# Patient Record
Sex: Female | Born: 1975
Health system: Southern US, Community
[De-identification: ages and names within clinical notes are randomized; demographics above are authoritative.]

## PROBLEM LIST (undated history)

## (undated) DIAGNOSIS — G43909 Migraine, unspecified, not intractable, without status migrainosus: Secondary | ICD-10-CM

## (undated) DIAGNOSIS — J449 Chronic obstructive pulmonary disease, unspecified: Secondary | ICD-10-CM

## (undated) DIAGNOSIS — F319 Bipolar disorder, unspecified: Secondary | ICD-10-CM

## (undated) DIAGNOSIS — E079 Disorder of thyroid, unspecified: Secondary | ICD-10-CM

## (undated) DIAGNOSIS — K219 Gastro-esophageal reflux disease without esophagitis: Secondary | ICD-10-CM

## (undated) DIAGNOSIS — J45909 Unspecified asthma, uncomplicated: Secondary | ICD-10-CM

## (undated) HISTORY — PX: ABDOMINAL HYSTERECTOMY: SHX81

---

## 2009-05-20 ENCOUNTER — Ambulatory Visit: Payer: Self-pay | Admitting: Family Medicine

## 2009-06-03 ENCOUNTER — Ambulatory Visit: Payer: Self-pay | Admitting: Family Medicine

## 2009-06-24 ENCOUNTER — Ambulatory Visit: Payer: Self-pay | Admitting: Family Medicine

## 2009-07-11 ENCOUNTER — Emergency Department (HOSPITAL_COMMUNITY): Admission: EM | Admit: 2009-07-11 | Discharge: 2009-07-11 | Payer: Self-pay | Admitting: Family Medicine

## 2009-07-22 ENCOUNTER — Encounter: Admission: RE | Admit: 2009-07-22 | Discharge: 2009-07-22 | Payer: Self-pay | Admitting: Emergency Medicine

## 2009-08-20 ENCOUNTER — Encounter: Admission: RE | Admit: 2009-08-20 | Discharge: 2009-11-18 | Payer: Self-pay | Admitting: Family Medicine

## 2009-09-01 ENCOUNTER — Ambulatory Visit: Payer: Self-pay | Admitting: Family Medicine

## 2009-11-27 ENCOUNTER — Ambulatory Visit: Payer: Self-pay | Admitting: Family Medicine

## 2009-12-30 ENCOUNTER — Ambulatory Visit: Payer: Self-pay | Admitting: Family Medicine

## 2010-01-14 ENCOUNTER — Ambulatory Visit: Payer: Self-pay | Admitting: Family Medicine

## 2010-01-19 ENCOUNTER — Encounter: Admission: RE | Admit: 2010-01-19 | Discharge: 2010-01-19 | Payer: Self-pay | Admitting: Family Medicine

## 2010-01-19 ENCOUNTER — Ambulatory Visit: Payer: Self-pay | Admitting: Family Medicine

## 2010-01-26 ENCOUNTER — Encounter: Admission: RE | Admit: 2010-01-26 | Discharge: 2010-01-26 | Payer: Self-pay | Admitting: Family Medicine

## 2010-02-12 ENCOUNTER — Ambulatory Visit (HOSPITAL_BASED_OUTPATIENT_CLINIC_OR_DEPARTMENT_OTHER): Admission: RE | Admit: 2010-02-12 | Discharge: 2010-02-12 | Payer: Self-pay | Admitting: Family Medicine

## 2010-02-15 ENCOUNTER — Ambulatory Visit: Payer: Self-pay | Admitting: Internal Medicine

## 2010-02-17 ENCOUNTER — Encounter: Admission: RE | Admit: 2010-02-17 | Discharge: 2010-02-17 | Payer: Self-pay | Admitting: Unknown Physician Specialty

## 2010-03-12 ENCOUNTER — Encounter: Admission: RE | Admit: 2010-03-12 | Discharge: 2010-03-12 | Payer: Self-pay | Admitting: Unknown Physician Specialty

## 2010-05-28 ENCOUNTER — Ambulatory Visit: Payer: Self-pay | Admitting: Family Medicine

## 2010-09-06 ENCOUNTER — Encounter: Payer: Self-pay | Admitting: Unknown Physician Specialty

## 2010-09-16 ENCOUNTER — Encounter: Payer: Self-pay | Admitting: Family Medicine

## 2010-09-28 ENCOUNTER — Ambulatory Visit: Payer: Self-pay | Admitting: Family Medicine

## 2011-01-04 ENCOUNTER — Other Ambulatory Visit: Payer: Self-pay | Admitting: Family Medicine

## 2011-04-07 ENCOUNTER — Other Ambulatory Visit: Payer: Self-pay | Admitting: Family Medicine

## 2011-05-06 ENCOUNTER — Other Ambulatory Visit: Payer: Self-pay | Admitting: Family Medicine

## 2011-06-02 ENCOUNTER — Other Ambulatory Visit: Payer: Self-pay | Admitting: Family Medicine

## 2011-06-07 ENCOUNTER — Other Ambulatory Visit: Payer: Self-pay | Admitting: Family Medicine

## 2011-11-23 ENCOUNTER — Other Ambulatory Visit: Payer: Self-pay | Admitting: Family Medicine

## 2011-12-15 ENCOUNTER — Other Ambulatory Visit: Payer: Self-pay | Admitting: Family Medicine

## 2011-12-17 ENCOUNTER — Other Ambulatory Visit: Payer: Self-pay | Admitting: Family Medicine

## 2011-12-17 DIAGNOSIS — N63 Unspecified lump in unspecified breast: Secondary | ICD-10-CM

## 2011-12-17 DIAGNOSIS — N644 Mastodynia: Secondary | ICD-10-CM

## 2011-12-21 ENCOUNTER — Other Ambulatory Visit: Payer: Self-pay | Admitting: Family Medicine

## 2011-12-21 ENCOUNTER — Other Ambulatory Visit: Payer: Self-pay

## 2012-01-07 ENCOUNTER — Ambulatory Visit
Admission: RE | Admit: 2012-01-07 | Discharge: 2012-01-07 | Disposition: A | Discharge status: Home / Self Care | Payer: PRIVATE HEALTH INSURANCE | Source: Ambulatory Visit | Attending: Family Medicine | Admitting: Family Medicine

## 2012-01-07 DIAGNOSIS — N63 Unspecified lump in unspecified breast: Secondary | ICD-10-CM

## 2012-01-07 DIAGNOSIS — N644 Mastodynia: Secondary | ICD-10-CM

## 2015-12-19 ENCOUNTER — Ambulatory Visit (HOSPITAL_COMMUNITY): Payer: Self-pay | Admitting: Psychiatry

## 2016-01-01 ENCOUNTER — Ambulatory Visit (HOSPITAL_COMMUNITY): Payer: Self-pay | Admitting: Psychiatry

## 2016-01-15 ENCOUNTER — Ambulatory Visit (HOSPITAL_COMMUNITY): Payer: Self-pay | Admitting: Psychiatry

## 2018-02-17 DIAGNOSIS — M791 Myalgia, unspecified site: Secondary | ICD-10-CM | POA: Diagnosis not present

## 2018-02-22 DIAGNOSIS — G894 Chronic pain syndrome: Secondary | ICD-10-CM | POA: Diagnosis not present

## 2018-02-22 DIAGNOSIS — M545 Low back pain: Secondary | ICD-10-CM | POA: Diagnosis not present

## 2018-02-22 DIAGNOSIS — M25561 Pain in right knee: Secondary | ICD-10-CM | POA: Diagnosis not present

## 2018-02-22 DIAGNOSIS — M542 Cervicalgia: Secondary | ICD-10-CM | POA: Diagnosis not present

## 2018-02-22 DIAGNOSIS — M25579 Pain in unspecified ankle and joints of unspecified foot: Secondary | ICD-10-CM | POA: Diagnosis not present

## 2018-03-09 DIAGNOSIS — R42 Dizziness and giddiness: Secondary | ICD-10-CM | POA: Diagnosis not present

## 2018-03-09 DIAGNOSIS — Z888 Allergy status to other drugs, medicaments and biological substances status: Secondary | ICD-10-CM | POA: Diagnosis not present

## 2018-03-09 DIAGNOSIS — Z7951 Long term (current) use of inhaled steroids: Secondary | ICD-10-CM | POA: Diagnosis not present

## 2018-03-09 DIAGNOSIS — G8929 Other chronic pain: Secondary | ICD-10-CM | POA: Diagnosis not present

## 2018-03-09 DIAGNOSIS — Z79899 Other long term (current) drug therapy: Secondary | ICD-10-CM | POA: Diagnosis not present

## 2018-03-09 DIAGNOSIS — Z880 Allergy status to penicillin: Secondary | ICD-10-CM | POA: Diagnosis not present

## 2018-03-09 DIAGNOSIS — N390 Urinary tract infection, site not specified: Secondary | ICD-10-CM | POA: Diagnosis not present

## 2018-03-09 DIAGNOSIS — Z7984 Long term (current) use of oral hypoglycemic drugs: Secondary | ICD-10-CM | POA: Diagnosis not present

## 2018-03-09 DIAGNOSIS — J449 Chronic obstructive pulmonary disease, unspecified: Secondary | ICD-10-CM | POA: Diagnosis not present

## 2018-03-09 DIAGNOSIS — E876 Hypokalemia: Secondary | ICD-10-CM | POA: Diagnosis not present

## 2018-03-09 DIAGNOSIS — E079 Disorder of thyroid, unspecified: Secondary | ICD-10-CM | POA: Diagnosis not present

## 2018-03-09 DIAGNOSIS — H8110 Benign paroxysmal vertigo, unspecified ear: Secondary | ICD-10-CM | POA: Diagnosis not present

## 2018-03-09 DIAGNOSIS — Z79891 Long term (current) use of opiate analgesic: Secondary | ICD-10-CM | POA: Diagnosis not present

## 2018-03-09 DIAGNOSIS — Z882 Allergy status to sulfonamides status: Secondary | ICD-10-CM | POA: Diagnosis not present

## 2018-03-09 DIAGNOSIS — E119 Type 2 diabetes mellitus without complications: Secondary | ICD-10-CM | POA: Diagnosis not present

## 2018-03-22 DIAGNOSIS — M542 Cervicalgia: Secondary | ICD-10-CM | POA: Diagnosis not present

## 2018-03-22 DIAGNOSIS — M545 Low back pain: Secondary | ICD-10-CM | POA: Diagnosis not present

## 2018-03-22 DIAGNOSIS — M25579 Pain in unspecified ankle and joints of unspecified foot: Secondary | ICD-10-CM | POA: Diagnosis not present

## 2018-03-22 DIAGNOSIS — Z79899 Other long term (current) drug therapy: Secondary | ICD-10-CM | POA: Diagnosis not present

## 2018-03-22 DIAGNOSIS — G894 Chronic pain syndrome: Secondary | ICD-10-CM | POA: Diagnosis not present

## 2018-03-22 DIAGNOSIS — M25561 Pain in right knee: Secondary | ICD-10-CM | POA: Diagnosis not present

## 2018-03-25 DIAGNOSIS — L0211 Cutaneous abscess of neck: Secondary | ICD-10-CM | POA: Diagnosis not present

## 2018-03-26 ENCOUNTER — Emergency Department
Admission: EM | Admit: 2018-03-26 | Discharge: 2018-03-26 | Disposition: A | Discharge status: Home / Self Care | Payer: Medicare Other | Attending: Emergency Medicine | Admitting: Emergency Medicine

## 2018-03-26 ENCOUNTER — Encounter: Payer: Self-pay | Admitting: Emergency Medicine

## 2018-03-26 ENCOUNTER — Other Ambulatory Visit: Payer: Self-pay

## 2018-03-26 DIAGNOSIS — J449 Chronic obstructive pulmonary disease, unspecified: Secondary | ICD-10-CM | POA: Insufficient documentation

## 2018-03-26 DIAGNOSIS — F17200 Nicotine dependence, unspecified, uncomplicated: Secondary | ICD-10-CM | POA: Diagnosis not present

## 2018-03-26 DIAGNOSIS — L02811 Cutaneous abscess of head [any part, except face]: Secondary | ICD-10-CM | POA: Diagnosis not present

## 2018-03-26 DIAGNOSIS — L0291 Cutaneous abscess, unspecified: Secondary | ICD-10-CM

## 2018-03-26 DIAGNOSIS — R220 Localized swelling, mass and lump, head: Secondary | ICD-10-CM | POA: Diagnosis present

## 2018-03-26 DIAGNOSIS — L02212 Cutaneous abscess of back [any part, except buttock]: Secondary | ICD-10-CM | POA: Diagnosis not present

## 2018-03-26 DIAGNOSIS — L0211 Cutaneous abscess of neck: Secondary | ICD-10-CM | POA: Diagnosis not present

## 2018-03-26 HISTORY — DX: Bipolar disorder, unspecified: F31.9

## 2018-03-26 HISTORY — DX: Gastro-esophageal reflux disease without esophagitis: K21.9

## 2018-03-26 HISTORY — DX: Chronic obstructive pulmonary disease, unspecified: J44.9

## 2018-03-26 HISTORY — DX: Unspecified asthma, uncomplicated: J45.909

## 2018-03-26 HISTORY — DX: Disorder of thyroid, unspecified: E07.9

## 2018-03-26 HISTORY — DX: Migraine, unspecified, not intractable, without status migrainosus: G43.909

## 2018-03-26 MED ORDER — LIDOCAINE HCL (PF) 1 % IJ SOLN
5.0000 mL | Freq: Once | INTRAMUSCULAR | Status: AC
  Administered 2018-03-26: 5 mL
  Filled 2018-03-26: qty 5

## 2018-03-26 MED ORDER — LIDOCAINE 5 % EX OINT: 1.0000 "application " | TOPICAL_OINTMENT | CUTANEOUS | 0 refills | Status: AC | PRN

## 2018-03-26 NOTE — ED Notes (Signed)
Boil back of neck x  1 week getting worse -  Tender to touch  Nothing  Laurie Zimmerman going on

## 2018-03-26 NOTE — ED Triage Notes (Signed)
Pt to ed c/o abscess in the back of her head. Pt is in NAD at this time.

## 2018-03-26 NOTE — ED Provider Notes (Signed)
[facility] Emergency Department Provider Note  ____________________________________________   First MD Initiated Contact with Patient 03/26/18 1620     (approximate)  I have reviewed the triage vital signs and the nursing notes.   HISTORY  Chief Complaint Abscess    HPI Lucill Mauck is a 42 y.o. female presents emergency department complaining of an abscess to the back of her head/neck.  Symptoms for 1 week.  She has been on clindamycin for 1 day.  She states the area is becoming larger and is more painful.  She denies any fever or chills.  She has been trying to pop this on her own.    Past Medical History:  Diagnosis Date  . Asthma   . Bipolar 1 disorder (HCC)   . COPD (chronic obstructive pulmonary disease) (HCC)   . GERD (gastroesophageal reflux disease)   . Migraines   . Thyroid disease     There are no active problems to display for this patient.   Past Surgical History:  Procedure Laterality Date  . ABDOMINAL HYSTERECTOMY      Prior to Admission medications   Medication Sig Start Date End Date Taking? Authorizing Provider  lidocaine (XYLOCAINE) 5 % ointment Apply 1 application topically as needed. 03/26/18   Shilah Hefel, Roselyn Bering, PA-C    Allergies Amoxicillin; Clonazepam; Lamictal [lamotrigine]; and Sulfa antibiotics  No family history on file.  Social History Social History   Tobacco Use  . Smoking status: Current Every Day Smoker  . Smokeless tobacco: Never Used  Substance Use Topics  . Alcohol use: Not Currently  . Drug use: Not Currently    Review of Systems  Constitutional: No fever/chills Eyes: No visual changes. ENT: No sore throat. Respiratory: Denies cough Genitourinary: Negative for dysuria. Musculoskeletal: Negative for back pain. Skin: Negative for rash.  Positive abscess    ____________________________________________   PHYSICAL EXAM:  VITAL SIGNS: ED Triage Vitals [03/26/18 1615]  Enc Vitals Group       BP 125/70     Pulse Rate 93     Resp 16     Temp 98.1 F (36.7 C)     Temp Source Oral     SpO2 100 %     Weight      Height      Head Circumference      Peak Flow      Pain Score 10     Pain Loc      Pain Edu?      Excl. in GC?     Constitutional: Alert and oriented. Well appearing and in no acute distress. Eyes: Conjunctivae are normal.  Head: Atraumatic. Nose: No congestion/rhinnorhea. Mouth/Throat: Mucous membranes are moist.   Neck:  supple no lymphadenopathy noted.  Abscess noted at the posterior skull/neck.  The area is red and tender.  Slightly swollen.  No fluctuance is noted. Cardiovascular: Normal rate, regular rhythm. Heart sounds are normal Respiratory: Normal respiratory effort.  No retractions, lungs c t a  Abd: soft nontender bs normal all 4 quad GU: deferred Musculoskeletal: FROM all extremities, warm and well perfused Neurologic:  Normal speech and language.  Skin:  Skin is warm, dry and intact. No rash noted.  Positive abscess at the posterior neck Psychiatric: Mood and affect are normal. Speech and behavior are normal.  ____________________________________________   LABS (all labs ordered are listed, but only abnormal results are displayed)  Labs Reviewed - No data to display ____________________________________________   ____________________________________________  RADIOLOGY  ____________________________________________   PROCEDURES  Procedure(s) performed:   Marland Kitchen.Marland Kitchen.Incision and Drainage Date/Time: 03/26/2018 5:01 PM Performed by: Faythe GheeFisher, Daymeon Fischman W, PA-C Authorized by: Faythe GheeFisher, Raliyah Montella W, PA-C   Consent:    Consent obtained:  Verbal   Consent given by:  Patient   Risks discussed:  Bleeding, incomplete drainage, pain and infection   Alternatives discussed:  No treatment Location:    Type:  Abscess   Location:  Neck Pre-procedure details:    Skin preparation:  Betadine Anesthesia (see MAR for exact dosages):    Anesthesia method:   Local infiltration   Local anesthetic:  Lidocaine 1% w/o epi Procedure type:    Complexity:  Simple Procedure details:    Incision types:  Single straight   Incision depth:  Dermal   Scalpel blade:  11   Drainage:  Bloody   Drainage amount:  Scant   Wound treatment:  Wound left open   Packing materials:  None Post-procedure details:    Patient tolerance of procedure:  Tolerated well, no immediate complications      ____________________________________________   INITIAL IMPRESSION / ASSESSMENT AND PLAN / ED COURSE  Pertinent labs & imaging results that were available during my care of the patient were reviewed by me and considered in my medical decision making (see chart for details).   Patient is a 42 year old female presents emergency department complaining of an abscess to the posterior neck.  Symptoms for 1 week.  Started on clindamycin yesterday.  States the area is getting bigger and is more painful.  Patient takes oxycodone 20 mg daily.  On physical exam patient appears well.  There is 1/4-50 cents sized abscess which is swollen and tender.  The area is not fluctuant.  It is hard and indurated.  Explained to the patient that even if I make an incision I do not believe we will get any pus out at this time.  However the patient wants to proceed with the procedure.  1% Xylocaine was used as local infiltrate.  #11 blade used for an incision.  No pus was expelled there was only bloody drainage.  Dressing was applied by nursing staff.  Explained to the patient that she needs apply warm compress to the area.  Do not squeeze the area as this is creating some of the hard swollen area.  She was given a prescription for lidocaine ointment to applied to the area.  Told her I have concerns and given her any additional pain medication due to the fact that she is on oxycodone 20 mg.  She is to continue the clindamycin.  Return to emergency department if worsening.  She states she understands  will comply.  She was discharged in stable condition     As part of my medical decision making, I reviewed the following data within the electronic MEDICAL RECORD NUMBER Nursing notes reviewed and incorporated, Old chart reviewed, Notes from prior ED visits and [place] Controlled Substance Database  ____________________________________________   FINAL CLINICAL IMPRESSION(S) / ED DIAGNOSES  Final diagnoses:  Abscess      NEW MEDICATIONS STARTED DURING THIS VISIT:  New Prescriptions   LIDOCAINE (XYLOCAINE) 5 % OINTMENT    Apply 1 application topically as needed.     Note:  This document was prepared using Dragon voice recognition software and may include unintentional dictation errors.    Faythe GheeFisher, Shernita Rabinovich W, PA-C 03/26/18 1705    Loleta RoseForbach, Cory, MD 03/26/18 [phone]

## 2018-03-26 NOTE — Discharge Instructions (Addendum)
Follow-up with your regular doctor or return emergency department if worsening.  If the area becomes fluctuant it will be an appropriate time for the area to be drained.  Continue the clindamycin.  Apply a warm compress to the area.  Apply the lidocaine ointment to the area every 6 hours as needed.

## 2018-03-29 ENCOUNTER — Other Ambulatory Visit: Payer: Self-pay

## 2018-03-29 ENCOUNTER — Encounter: Payer: Self-pay | Admitting: *Deleted

## 2018-03-29 ENCOUNTER — Emergency Department
Admission: EM | Admit: 2018-03-29 | Discharge: 2018-03-29 | Disposition: A | Discharge status: Home / Self Care | Payer: Medicare Other | Attending: Emergency Medicine | Admitting: Emergency Medicine

## 2018-03-29 DIAGNOSIS — L0211 Cutaneous abscess of neck: Secondary | ICD-10-CM | POA: Insufficient documentation

## 2018-03-29 DIAGNOSIS — Z5321 Procedure and treatment not carried out due to patient leaving prior to being seen by health care provider: Secondary | ICD-10-CM | POA: Insufficient documentation

## 2018-03-29 NOTE — ED Notes (Signed)
No answer when called for exam room.  

## 2018-03-29 NOTE — ED Triage Notes (Signed)
Pt has abscess on back of head/neck.  Draining today.  Pt was seen in er recently for same sx.

## 2018-03-29 NOTE — ED Notes (Signed)
Attempted to call for treatment room x 3 no response.

## 2018-03-29 NOTE — ED Triage Notes (Signed)
First nurse note: Patient with small abscess to back of neck. Denies any known fevers. Patient reports area is already draining.

## 2018-03-30 ENCOUNTER — Encounter: Payer: Self-pay | Admitting: *Deleted

## 2018-04-06 DIAGNOSIS — R300 Dysuria: Secondary | ICD-10-CM | POA: Diagnosis not present

## 2018-04-06 DIAGNOSIS — N390 Urinary tract infection, site not specified: Secondary | ICD-10-CM | POA: Diagnosis not present

## 2018-04-20 DIAGNOSIS — M25561 Pain in right knee: Secondary | ICD-10-CM | POA: Diagnosis not present

## 2018-04-20 DIAGNOSIS — M545 Low back pain: Secondary | ICD-10-CM | POA: Diagnosis not present

## 2018-04-20 DIAGNOSIS — M25579 Pain in unspecified ankle and joints of unspecified foot: Secondary | ICD-10-CM | POA: Diagnosis not present

## 2018-04-20 DIAGNOSIS — M542 Cervicalgia: Secondary | ICD-10-CM | POA: Diagnosis not present

## 2018-04-20 DIAGNOSIS — G894 Chronic pain syndrome: Secondary | ICD-10-CM | POA: Diagnosis not present

## 2018-04-26 DIAGNOSIS — E039 Hypothyroidism, unspecified: Secondary | ICD-10-CM | POA: Diagnosis not present

## 2018-04-26 DIAGNOSIS — E7800 Pure hypercholesterolemia, unspecified: Secondary | ICD-10-CM | POA: Diagnosis not present

## 2018-04-26 DIAGNOSIS — E559 Vitamin D deficiency, unspecified: Secondary | ICD-10-CM | POA: Diagnosis not present

## 2018-04-26 DIAGNOSIS — E1165 Type 2 diabetes mellitus with hyperglycemia: Secondary | ICD-10-CM | POA: Diagnosis not present

## 2018-04-26 DIAGNOSIS — R5383 Other fatigue: Secondary | ICD-10-CM | POA: Diagnosis not present

## 2018-04-26 DIAGNOSIS — Z79899 Other long term (current) drug therapy: Secondary | ICD-10-CM | POA: Diagnosis not present

## 2018-05-14 DIAGNOSIS — L02411 Cutaneous abscess of right axilla: Secondary | ICD-10-CM | POA: Diagnosis not present

## 2018-05-18 DIAGNOSIS — M25579 Pain in unspecified ankle and joints of unspecified foot: Secondary | ICD-10-CM | POA: Diagnosis not present

## 2018-05-18 DIAGNOSIS — M545 Low back pain: Secondary | ICD-10-CM | POA: Diagnosis not present

## 2018-05-18 DIAGNOSIS — G894 Chronic pain syndrome: Secondary | ICD-10-CM | POA: Diagnosis not present

## 2018-05-18 DIAGNOSIS — M542 Cervicalgia: Secondary | ICD-10-CM | POA: Diagnosis not present

## 2018-05-18 DIAGNOSIS — M25561 Pain in right knee: Secondary | ICD-10-CM | POA: Diagnosis not present

## 2018-05-23 DIAGNOSIS — L989 Disorder of the skin and subcutaneous tissue, unspecified: Secondary | ICD-10-CM | POA: Diagnosis not present

## 2018-05-23 DIAGNOSIS — L0291 Cutaneous abscess, unspecified: Secondary | ICD-10-CM | POA: Diagnosis not present

## 2018-06-13 DIAGNOSIS — M25512 Pain in left shoulder: Secondary | ICD-10-CM | POA: Diagnosis not present

## 2018-06-13 DIAGNOSIS — Z79899 Other long term (current) drug therapy: Secondary | ICD-10-CM | POA: Diagnosis not present

## 2018-06-13 DIAGNOSIS — M25511 Pain in right shoulder: Secondary | ICD-10-CM | POA: Diagnosis not present

## 2018-06-13 DIAGNOSIS — M542 Cervicalgia: Secondary | ICD-10-CM | POA: Diagnosis not present

## 2018-06-13 DIAGNOSIS — G8929 Other chronic pain: Secondary | ICD-10-CM | POA: Diagnosis not present

## 2018-06-14 DIAGNOSIS — M25579 Pain in unspecified ankle and joints of unspecified foot: Secondary | ICD-10-CM | POA: Diagnosis not present

## 2018-06-14 DIAGNOSIS — M545 Low back pain: Secondary | ICD-10-CM | POA: Diagnosis not present

## 2018-06-14 DIAGNOSIS — M542 Cervicalgia: Secondary | ICD-10-CM | POA: Diagnosis not present

## 2018-06-14 DIAGNOSIS — G894 Chronic pain syndrome: Secondary | ICD-10-CM | POA: Diagnosis not present

## 2018-06-14 DIAGNOSIS — M25561 Pain in right knee: Secondary | ICD-10-CM | POA: Diagnosis not present

## 2018-06-19 DIAGNOSIS — E7800 Pure hypercholesterolemia, unspecified: Secondary | ICD-10-CM | POA: Diagnosis not present

## 2018-06-19 DIAGNOSIS — Z131 Encounter for screening for diabetes mellitus: Secondary | ICD-10-CM | POA: Diagnosis not present

## 2018-06-19 DIAGNOSIS — R5383 Other fatigue: Secondary | ICD-10-CM | POA: Diagnosis not present

## 2018-06-19 DIAGNOSIS — Z79899 Other long term (current) drug therapy: Secondary | ICD-10-CM | POA: Diagnosis not present

## 2018-06-22 DIAGNOSIS — E7800 Pure hypercholesterolemia, unspecified: Secondary | ICD-10-CM | POA: Diagnosis not present

## 2018-06-22 DIAGNOSIS — Z0000 Encounter for general adult medical examination without abnormal findings: Secondary | ICD-10-CM | POA: Diagnosis not present

## 2018-06-22 DIAGNOSIS — R0602 Shortness of breath: Secondary | ICD-10-CM | POA: Diagnosis not present

## 2018-06-22 DIAGNOSIS — E1165 Type 2 diabetes mellitus with hyperglycemia: Secondary | ICD-10-CM | POA: Diagnosis not present

## 2018-06-22 DIAGNOSIS — E039 Hypothyroidism, unspecified: Secondary | ICD-10-CM | POA: Diagnosis not present

## 2018-06-28 DIAGNOSIS — E119 Type 2 diabetes mellitus without complications: Secondary | ICD-10-CM | POA: Diagnosis not present

## 2018-06-28 DIAGNOSIS — H527 Unspecified disorder of refraction: Secondary | ICD-10-CM | POA: Diagnosis not present

## 2018-07-12 DIAGNOSIS — G894 Chronic pain syndrome: Secondary | ICD-10-CM | POA: Diagnosis not present

## 2018-07-12 DIAGNOSIS — M25561 Pain in right knee: Secondary | ICD-10-CM | POA: Diagnosis not present

## 2018-07-12 DIAGNOSIS — M545 Low back pain: Secondary | ICD-10-CM | POA: Diagnosis not present

## 2018-07-12 DIAGNOSIS — M25579 Pain in unspecified ankle and joints of unspecified foot: Secondary | ICD-10-CM | POA: Diagnosis not present

## 2018-07-12 DIAGNOSIS — M542 Cervicalgia: Secondary | ICD-10-CM | POA: Diagnosis not present

## 2018-07-17 DIAGNOSIS — Z79899 Other long term (current) drug therapy: Secondary | ICD-10-CM | POA: Diagnosis not present

## 2018-07-19 DIAGNOSIS — B351 Tinea unguium: Secondary | ICD-10-CM | POA: Diagnosis not present

## 2018-07-19 DIAGNOSIS — M79674 Pain in right toe(s): Secondary | ICD-10-CM | POA: Diagnosis not present

## 2018-07-19 DIAGNOSIS — E1140 Type 2 diabetes mellitus with diabetic neuropathy, unspecified: Secondary | ICD-10-CM | POA: Diagnosis not present

## 2018-07-19 DIAGNOSIS — M79675 Pain in left toe(s): Secondary | ICD-10-CM | POA: Diagnosis not present

## 2018-07-20 DIAGNOSIS — R0989 Other specified symptoms and signs involving the circulatory and respiratory systems: Secondary | ICD-10-CM | POA: Diagnosis not present

## 2018-07-20 DIAGNOSIS — M79604 Pain in right leg: Secondary | ICD-10-CM | POA: Diagnosis not present

## 2018-07-20 DIAGNOSIS — M79605 Pain in left leg: Secondary | ICD-10-CM | POA: Diagnosis not present

## 2018-07-20 DIAGNOSIS — R0602 Shortness of breath: Secondary | ICD-10-CM | POA: Diagnosis not present

## 2018-08-03 DIAGNOSIS — R0602 Shortness of breath: Secondary | ICD-10-CM | POA: Diagnosis not present

## 2018-08-03 DIAGNOSIS — E1165 Type 2 diabetes mellitus with hyperglycemia: Secondary | ICD-10-CM | POA: Diagnosis not present

## 2018-08-03 DIAGNOSIS — E782 Mixed hyperlipidemia: Secondary | ICD-10-CM | POA: Diagnosis not present

## 2018-08-08 DIAGNOSIS — R5383 Other fatigue: Secondary | ICD-10-CM | POA: Diagnosis not present

## 2018-08-08 DIAGNOSIS — Z131 Encounter for screening for diabetes mellitus: Secondary | ICD-10-CM | POA: Diagnosis not present

## 2018-08-08 DIAGNOSIS — Z0000 Encounter for general adult medical examination without abnormal findings: Secondary | ICD-10-CM | POA: Diagnosis not present

## 2018-08-08 DIAGNOSIS — Z79899 Other long term (current) drug therapy: Secondary | ICD-10-CM | POA: Diagnosis not present

## 2018-08-08 DIAGNOSIS — E7800 Pure hypercholesterolemia, unspecified: Secondary | ICD-10-CM | POA: Diagnosis not present

## 2018-08-08 DIAGNOSIS — J449 Chronic obstructive pulmonary disease, unspecified: Secondary | ICD-10-CM | POA: Diagnosis not present

## 2018-08-10 DIAGNOSIS — R0689 Other abnormalities of breathing: Secondary | ICD-10-CM | POA: Diagnosis not present

## 2018-08-10 DIAGNOSIS — J9801 Acute bronchospasm: Secondary | ICD-10-CM | POA: Diagnosis not present

## 2018-08-10 DIAGNOSIS — R942 Abnormal results of pulmonary function studies: Secondary | ICD-10-CM | POA: Diagnosis not present

## 2018-08-10 DIAGNOSIS — E1165 Type 2 diabetes mellitus with hyperglycemia: Secondary | ICD-10-CM | POA: Diagnosis not present

## 2018-08-10 DIAGNOSIS — Z7689 Persons encountering health services in other specified circumstances: Secondary | ICD-10-CM | POA: Diagnosis not present

## 2018-08-10 DIAGNOSIS — G8929 Other chronic pain: Secondary | ICD-10-CM | POA: Diagnosis not present

## 2018-08-10 DIAGNOSIS — R0602 Shortness of breath: Secondary | ICD-10-CM | POA: Diagnosis not present

## 2018-08-14 DIAGNOSIS — G894 Chronic pain syndrome: Secondary | ICD-10-CM | POA: Diagnosis not present

## 2018-08-14 DIAGNOSIS — M25561 Pain in right knee: Secondary | ICD-10-CM | POA: Diagnosis not present

## 2018-08-14 DIAGNOSIS — M545 Low back pain: Secondary | ICD-10-CM | POA: Diagnosis not present

## 2018-08-14 DIAGNOSIS — M542 Cervicalgia: Secondary | ICD-10-CM | POA: Diagnosis not present

## 2018-08-14 DIAGNOSIS — M25579 Pain in unspecified ankle and joints of unspecified foot: Secondary | ICD-10-CM | POA: Diagnosis not present

## 2018-09-11 DIAGNOSIS — M25579 Pain in unspecified ankle and joints of unspecified foot: Secondary | ICD-10-CM | POA: Diagnosis not present

## 2018-09-11 DIAGNOSIS — M545 Low back pain: Secondary | ICD-10-CM | POA: Diagnosis not present

## 2018-09-11 DIAGNOSIS — M25561 Pain in right knee: Secondary | ICD-10-CM | POA: Diagnosis not present

## 2018-09-11 DIAGNOSIS — G894 Chronic pain syndrome: Secondary | ICD-10-CM | POA: Diagnosis not present

## 2018-09-11 DIAGNOSIS — M542 Cervicalgia: Secondary | ICD-10-CM | POA: Diagnosis not present

## 2018-10-09 DIAGNOSIS — M25579 Pain in unspecified ankle and joints of unspecified foot: Secondary | ICD-10-CM | POA: Diagnosis not present

## 2018-10-09 DIAGNOSIS — G894 Chronic pain syndrome: Secondary | ICD-10-CM | POA: Diagnosis not present

## 2018-10-09 DIAGNOSIS — M25561 Pain in right knee: Secondary | ICD-10-CM | POA: Diagnosis not present

## 2018-10-09 DIAGNOSIS — M542 Cervicalgia: Secondary | ICD-10-CM | POA: Diagnosis not present

## 2018-10-09 DIAGNOSIS — M545 Low back pain: Secondary | ICD-10-CM | POA: Diagnosis not present

## 2018-11-05 DIAGNOSIS — R112 Nausea with vomiting, unspecified: Secondary | ICD-10-CM | POA: Diagnosis not present

## 2018-11-05 DIAGNOSIS — E1165 Type 2 diabetes mellitus with hyperglycemia: Secondary | ICD-10-CM | POA: Diagnosis not present

## 2018-11-06 DIAGNOSIS — M25561 Pain in right knee: Secondary | ICD-10-CM | POA: Diagnosis not present

## 2018-11-06 DIAGNOSIS — G894 Chronic pain syndrome: Secondary | ICD-10-CM | POA: Diagnosis not present

## 2018-11-06 DIAGNOSIS — M542 Cervicalgia: Secondary | ICD-10-CM | POA: Diagnosis not present

## 2018-11-06 DIAGNOSIS — M25579 Pain in unspecified ankle and joints of unspecified foot: Secondary | ICD-10-CM | POA: Diagnosis not present

## 2018-11-06 DIAGNOSIS — M545 Low back pain: Secondary | ICD-10-CM | POA: Diagnosis not present

## 2018-11-30 DIAGNOSIS — J449 Chronic obstructive pulmonary disease, unspecified: Secondary | ICD-10-CM | POA: Diagnosis not present

## 2018-11-30 DIAGNOSIS — E039 Hypothyroidism, unspecified: Secondary | ICD-10-CM | POA: Diagnosis not present

## 2018-11-30 DIAGNOSIS — E1165 Type 2 diabetes mellitus with hyperglycemia: Secondary | ICD-10-CM | POA: Diagnosis not present

## 2018-11-30 DIAGNOSIS — E7800 Pure hypercholesterolemia, unspecified: Secondary | ICD-10-CM | POA: Diagnosis not present

## 2018-12-04 DIAGNOSIS — M542 Cervicalgia: Secondary | ICD-10-CM | POA: Diagnosis not present

## 2018-12-04 DIAGNOSIS — M25579 Pain in unspecified ankle and joints of unspecified foot: Secondary | ICD-10-CM | POA: Diagnosis not present

## 2018-12-04 DIAGNOSIS — M25561 Pain in right knee: Secondary | ICD-10-CM | POA: Diagnosis not present

## 2018-12-04 DIAGNOSIS — G894 Chronic pain syndrome: Secondary | ICD-10-CM | POA: Diagnosis not present

## 2018-12-04 DIAGNOSIS — M545 Low back pain: Secondary | ICD-10-CM | POA: Diagnosis not present

## 2018-12-05 DIAGNOSIS — R079 Chest pain, unspecified: Secondary | ICD-10-CM | POA: Diagnosis not present

## 2018-12-05 DIAGNOSIS — E782 Mixed hyperlipidemia: Secondary | ICD-10-CM | POA: Diagnosis not present

## 2018-12-05 DIAGNOSIS — E1165 Type 2 diabetes mellitus with hyperglycemia: Secondary | ICD-10-CM | POA: Diagnosis not present

## 2018-12-14 DIAGNOSIS — R079 Chest pain, unspecified: Secondary | ICD-10-CM | POA: Diagnosis not present

## 2019-01-01 DIAGNOSIS — M542 Cervicalgia: Secondary | ICD-10-CM | POA: Diagnosis not present

## 2019-01-01 DIAGNOSIS — M545 Low back pain: Secondary | ICD-10-CM | POA: Diagnosis not present

## 2019-01-01 DIAGNOSIS — M25561 Pain in right knee: Secondary | ICD-10-CM | POA: Diagnosis not present

## 2019-01-01 DIAGNOSIS — M25579 Pain in unspecified ankle and joints of unspecified foot: Secondary | ICD-10-CM | POA: Diagnosis not present

## 2019-01-01 DIAGNOSIS — G894 Chronic pain syndrome: Secondary | ICD-10-CM | POA: Diagnosis not present

## 2019-01-02 DIAGNOSIS — R0602 Shortness of breath: Secondary | ICD-10-CM | POA: Diagnosis not present

## 2019-01-02 DIAGNOSIS — M79605 Pain in left leg: Secondary | ICD-10-CM | POA: Diagnosis not present

## 2019-01-02 DIAGNOSIS — R0989 Other specified symptoms and signs involving the circulatory and respiratory systems: Secondary | ICD-10-CM | POA: Diagnosis not present

## 2019-01-02 DIAGNOSIS — R079 Chest pain, unspecified: Secondary | ICD-10-CM | POA: Diagnosis not present

## 2019-01-02 DIAGNOSIS — M79604 Pain in right leg: Secondary | ICD-10-CM | POA: Diagnosis not present

## 2019-01-22 DIAGNOSIS — N61 Mastitis without abscess: Secondary | ICD-10-CM | POA: Diagnosis not present

## 2019-01-22 DIAGNOSIS — Z1159 Encounter for screening for other viral diseases: Secondary | ICD-10-CM | POA: Diagnosis not present

## 2019-01-22 DIAGNOSIS — R5383 Other fatigue: Secondary | ICD-10-CM | POA: Diagnosis not present

## 2019-01-24 DIAGNOSIS — R0602 Shortness of breath: Secondary | ICD-10-CM | POA: Diagnosis not present

## 2019-01-24 DIAGNOSIS — Z79899 Other long term (current) drug therapy: Secondary | ICD-10-CM | POA: Diagnosis not present

## 2019-01-29 DIAGNOSIS — G894 Chronic pain syndrome: Secondary | ICD-10-CM | POA: Diagnosis not present

## 2019-01-29 DIAGNOSIS — M542 Cervicalgia: Secondary | ICD-10-CM | POA: Diagnosis not present

## 2019-01-29 DIAGNOSIS — M25579 Pain in unspecified ankle and joints of unspecified foot: Secondary | ICD-10-CM | POA: Diagnosis not present

## 2019-01-29 DIAGNOSIS — M25561 Pain in right knee: Secondary | ICD-10-CM | POA: Diagnosis not present

## 2019-01-29 DIAGNOSIS — M545 Low back pain: Secondary | ICD-10-CM | POA: Diagnosis not present

## 2019-02-01 DIAGNOSIS — G4709 Other insomnia: Secondary | ICD-10-CM | POA: Diagnosis not present

## 2019-02-01 DIAGNOSIS — Z87891 Personal history of nicotine dependence: Secondary | ICD-10-CM | POA: Diagnosis not present

## 2019-02-01 DIAGNOSIS — Z79899 Other long term (current) drug therapy: Secondary | ICD-10-CM | POA: Diagnosis not present

## 2019-02-22 DIAGNOSIS — E7800 Pure hypercholesterolemia, unspecified: Secondary | ICD-10-CM | POA: Diagnosis not present

## 2019-02-22 DIAGNOSIS — E1165 Type 2 diabetes mellitus with hyperglycemia: Secondary | ICD-10-CM | POA: Diagnosis not present

## 2019-02-26 DIAGNOSIS — M542 Cervicalgia: Secondary | ICD-10-CM | POA: Diagnosis not present

## 2019-02-26 DIAGNOSIS — M25579 Pain in unspecified ankle and joints of unspecified foot: Secondary | ICD-10-CM | POA: Diagnosis not present

## 2019-02-26 DIAGNOSIS — M545 Low back pain: Secondary | ICD-10-CM | POA: Diagnosis not present

## 2019-02-26 DIAGNOSIS — G894 Chronic pain syndrome: Secondary | ICD-10-CM | POA: Diagnosis not present

## 2019-02-26 DIAGNOSIS — M25561 Pain in right knee: Secondary | ICD-10-CM | POA: Diagnosis not present

## 2019-03-07 DIAGNOSIS — Z79899 Other long term (current) drug therapy: Secondary | ICD-10-CM | POA: Diagnosis not present

## 2019-03-26 DIAGNOSIS — M25561 Pain in right knee: Secondary | ICD-10-CM | POA: Diagnosis not present

## 2019-03-26 DIAGNOSIS — M25579 Pain in unspecified ankle and joints of unspecified foot: Secondary | ICD-10-CM | POA: Diagnosis not present

## 2019-03-26 DIAGNOSIS — M545 Low back pain: Secondary | ICD-10-CM | POA: Diagnosis not present

## 2019-03-26 DIAGNOSIS — G894 Chronic pain syndrome: Secondary | ICD-10-CM | POA: Diagnosis not present

## 2019-03-26 DIAGNOSIS — M542 Cervicalgia: Secondary | ICD-10-CM | POA: Diagnosis not present

## 2019-04-25 DIAGNOSIS — M25579 Pain in unspecified ankle and joints of unspecified foot: Secondary | ICD-10-CM | POA: Diagnosis not present

## 2019-04-25 DIAGNOSIS — M542 Cervicalgia: Secondary | ICD-10-CM | POA: Diagnosis not present

## 2019-04-25 DIAGNOSIS — M25561 Pain in right knee: Secondary | ICD-10-CM | POA: Diagnosis not present

## 2019-04-25 DIAGNOSIS — M545 Low back pain: Secondary | ICD-10-CM | POA: Diagnosis not present

## 2019-04-25 DIAGNOSIS — G894 Chronic pain syndrome: Secondary | ICD-10-CM | POA: Diagnosis not present

## 2019-05-08 DIAGNOSIS — Z79899 Other long term (current) drug therapy: Secondary | ICD-10-CM | POA: Diagnosis not present

## 2019-05-08 DIAGNOSIS — E349 Endocrine disorder, unspecified: Secondary | ICD-10-CM | POA: Diagnosis not present

## 2019-05-08 DIAGNOSIS — Z131 Encounter for screening for diabetes mellitus: Secondary | ICD-10-CM | POA: Diagnosis not present

## 2019-05-08 DIAGNOSIS — E559 Vitamin D deficiency, unspecified: Secondary | ICD-10-CM | POA: Diagnosis not present

## 2019-05-08 DIAGNOSIS — R5383 Other fatigue: Secondary | ICD-10-CM | POA: Diagnosis not present

## 2019-05-08 DIAGNOSIS — Z1159 Encounter for screening for other viral diseases: Secondary | ICD-10-CM | POA: Diagnosis not present

## 2019-05-08 DIAGNOSIS — D539 Nutritional anemia, unspecified: Secondary | ICD-10-CM | POA: Diagnosis not present

## 2019-05-08 DIAGNOSIS — E7800 Pure hypercholesterolemia, unspecified: Secondary | ICD-10-CM | POA: Diagnosis not present

## 2019-05-08 DIAGNOSIS — E1165 Type 2 diabetes mellitus with hyperglycemia: Secondary | ICD-10-CM | POA: Diagnosis not present

## 2019-05-09 DIAGNOSIS — G4700 Insomnia, unspecified: Secondary | ICD-10-CM | POA: Diagnosis not present

## 2019-05-15 ENCOUNTER — Other Ambulatory Visit: Payer: Self-pay | Admitting: Nurse Practitioner

## 2019-05-15 DIAGNOSIS — Z1231 Encounter for screening mammogram for malignant neoplasm of breast: Secondary | ICD-10-CM

## 2019-05-18 ENCOUNTER — Other Ambulatory Visit: Payer: Self-pay | Admitting: Nurse Practitioner

## 2019-05-18 DIAGNOSIS — N644 Mastodynia: Secondary | ICD-10-CM

## 2019-05-21 ENCOUNTER — Ambulatory Visit
Admission: RE | Admit: 2019-05-21 | Discharge: 2019-05-21 | Disposition: A | Discharge status: Home / Self Care | Payer: Medicare Other | Source: Ambulatory Visit | Attending: Nurse Practitioner | Admitting: Nurse Practitioner

## 2019-05-21 ENCOUNTER — Ambulatory Visit: Payer: Medicare Other

## 2019-05-21 ENCOUNTER — Other Ambulatory Visit: Payer: Self-pay

## 2019-05-21 DIAGNOSIS — M25579 Pain in unspecified ankle and joints of unspecified foot: Secondary | ICD-10-CM | POA: Diagnosis not present

## 2019-05-21 DIAGNOSIS — M542 Cervicalgia: Secondary | ICD-10-CM | POA: Diagnosis not present

## 2019-05-21 DIAGNOSIS — M25561 Pain in right knee: Secondary | ICD-10-CM | POA: Diagnosis not present

## 2019-05-21 DIAGNOSIS — M545 Low back pain: Secondary | ICD-10-CM | POA: Diagnosis not present

## 2019-05-21 DIAGNOSIS — N644 Mastodynia: Secondary | ICD-10-CM

## 2019-05-21 DIAGNOSIS — G894 Chronic pain syndrome: Secondary | ICD-10-CM | POA: Diagnosis not present

## 2019-05-21 DIAGNOSIS — R928 Other abnormal and inconclusive findings on diagnostic imaging of breast: Secondary | ICD-10-CM | POA: Diagnosis not present

## 2019-05-31 DIAGNOSIS — H5213 Myopia, bilateral: Secondary | ICD-10-CM | POA: Diagnosis not present

## 2019-06-01 ENCOUNTER — Other Ambulatory Visit: Payer: Self-pay | Admitting: Nurse Practitioner

## 2019-06-01 DIAGNOSIS — N644 Mastodynia: Secondary | ICD-10-CM

## 2019-06-07 DIAGNOSIS — E1165 Type 2 diabetes mellitus with hyperglycemia: Secondary | ICD-10-CM | POA: Diagnosis not present

## 2019-07-23 ENCOUNTER — Ambulatory Visit: Payer: Medicare Other

## 2020-03-27 IMAGING — MG MM DIGITAL DIAGNOSTIC BILAT W/ TOMO W/ CAD
8 series · 8 of 24 positions shown · non-contrast
Comparison: Previous exam(s).

CLINICAL DATA: 43-year-old female presenting for evaluation of
bilateral diffuse intermittent breast pain for several months.

EXAM:
DIGITAL DIAGNOSTIC BILATERAL MAMMOGRAM WITH CAD AND TOMO

[R CC synth-2D]
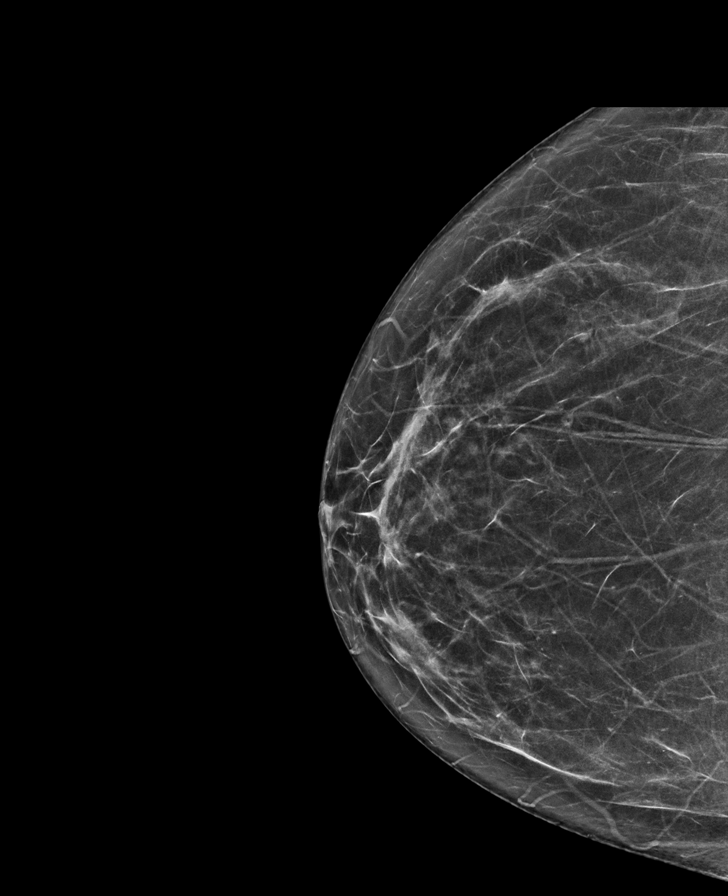

[L CC synth-2D]
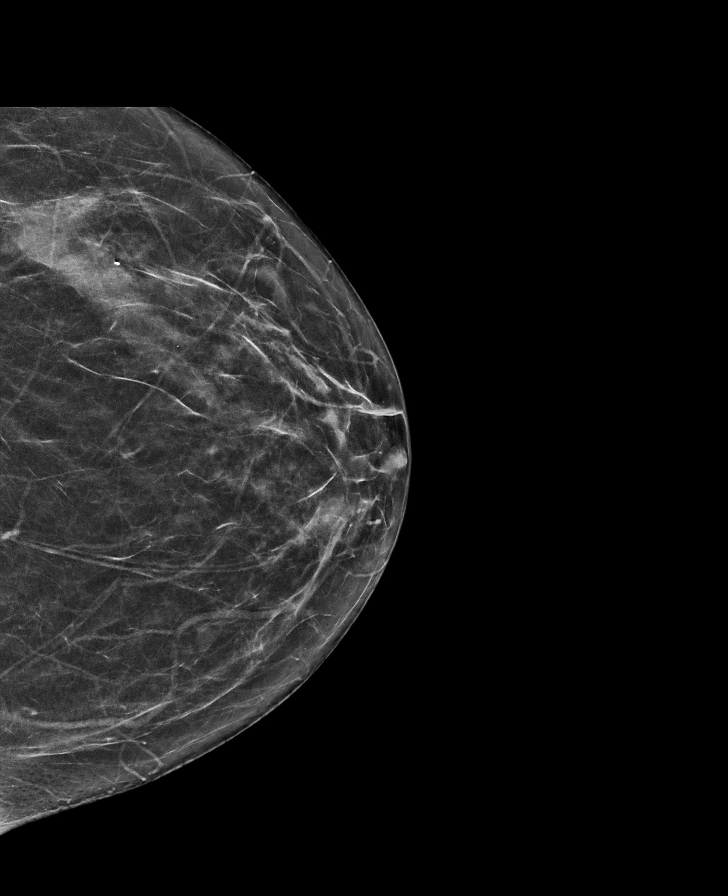

[R MLO synth-2D]
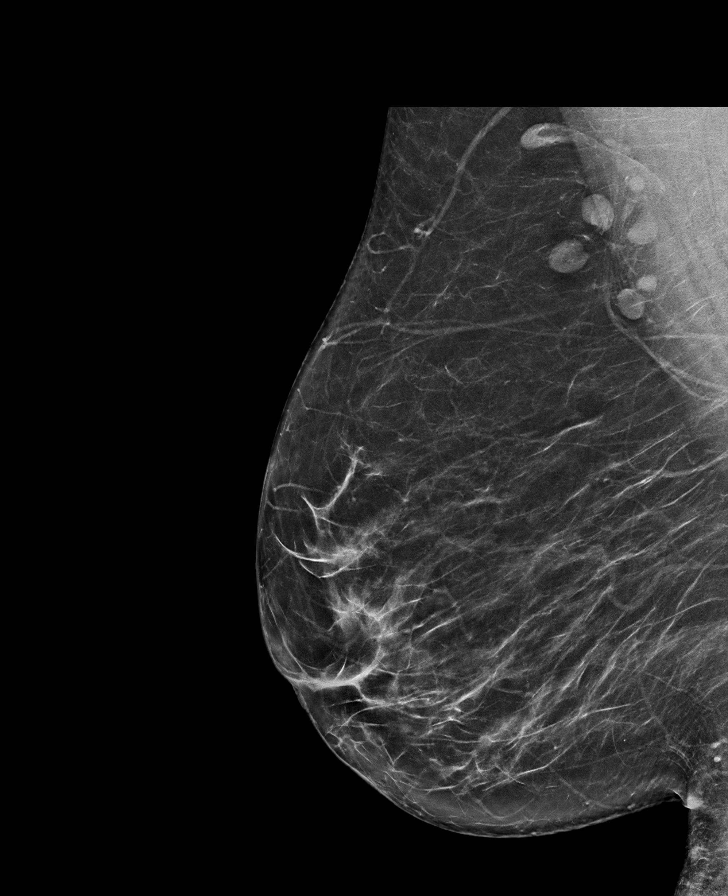

[L MLO synth-2D]
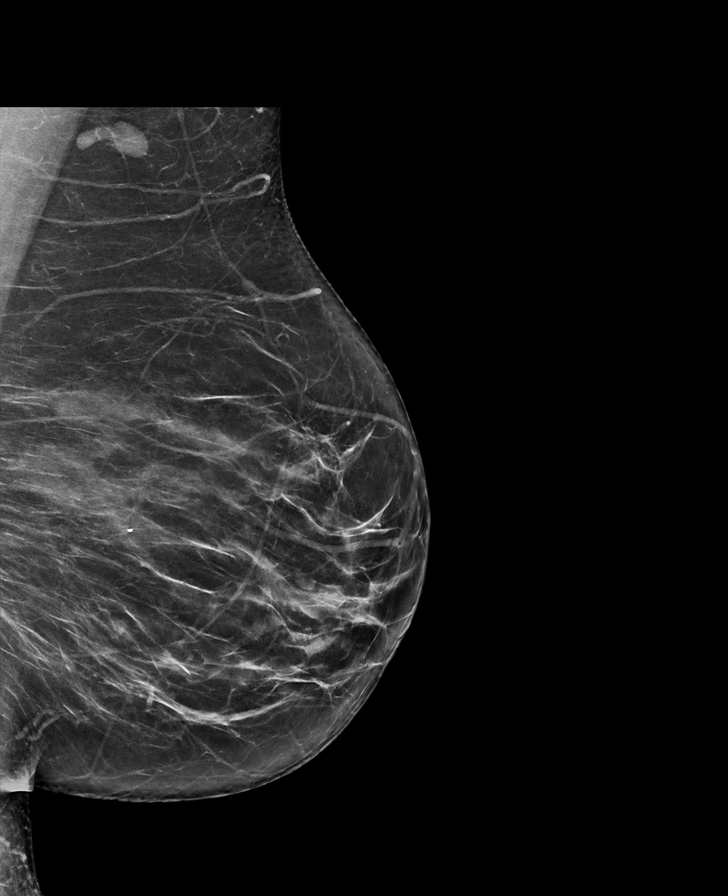

[R MLO tomo · tomo slice 41/80.0]
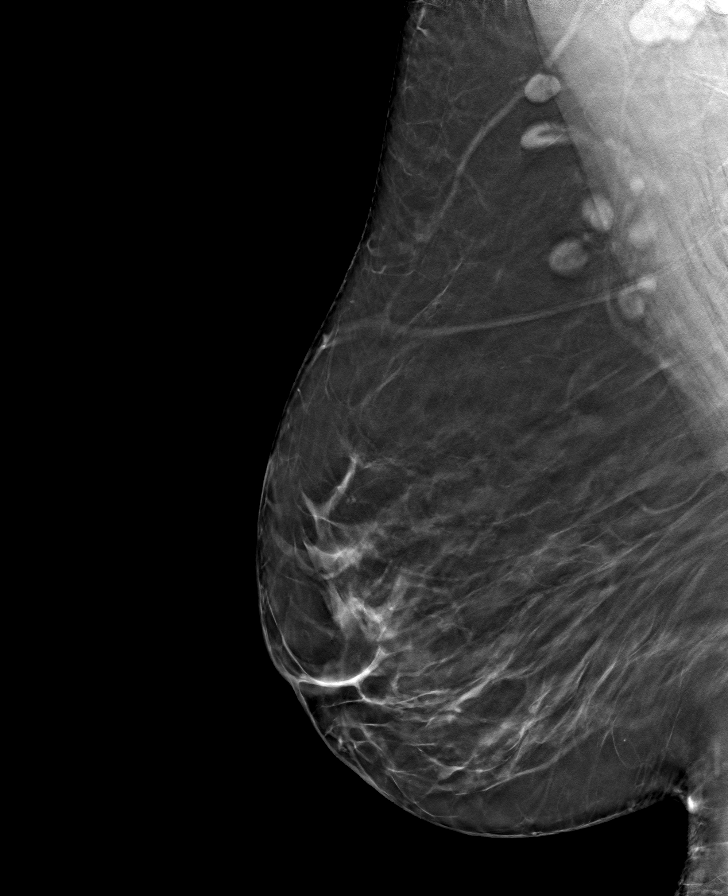

[R CC tomo · tomo slice 33/66.0]
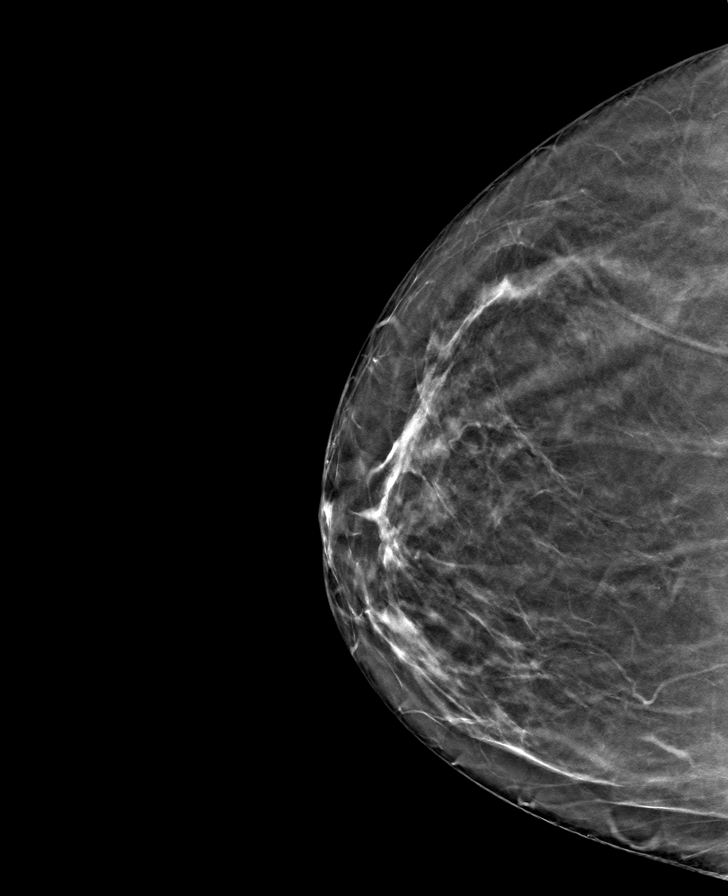

[L MLO tomo · tomo slice 37/72.0]
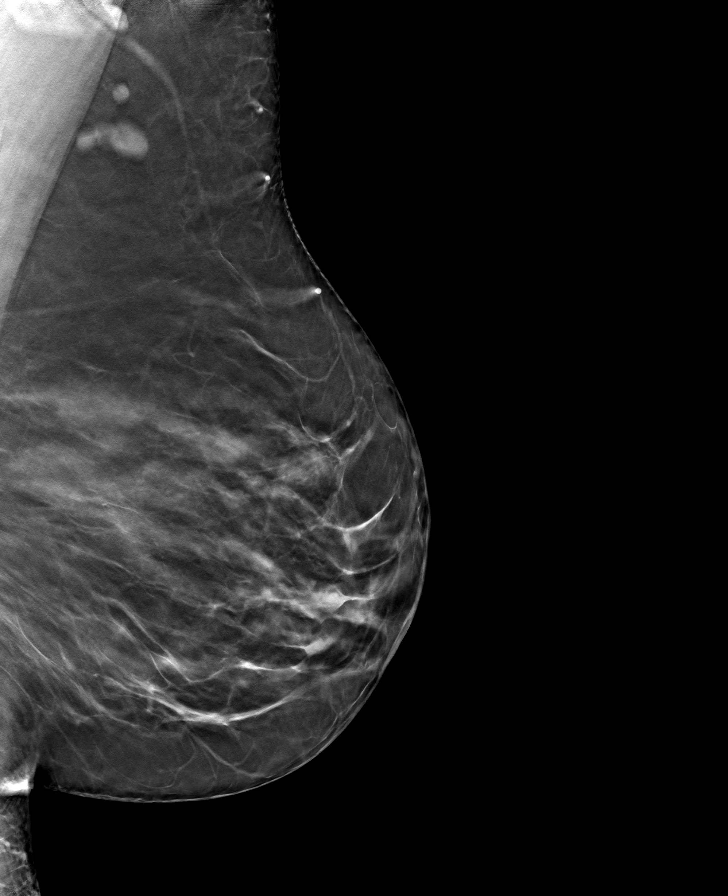

[L CC tomo · tomo slice 31/61.0]
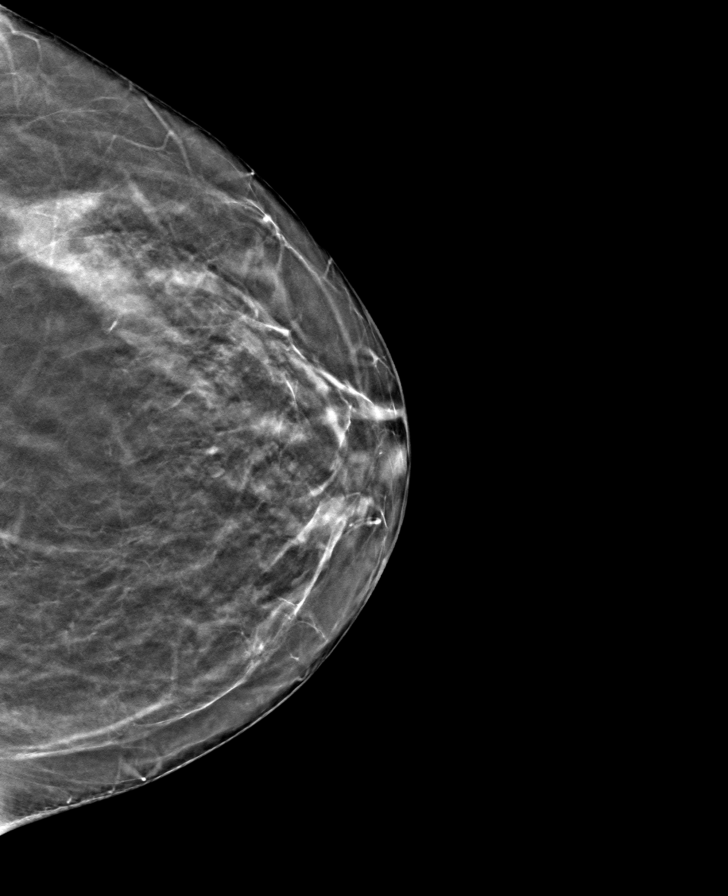

[8 of 24 positions shown; findings below may reference images not displayed]

ACR Breast Density Category b: There are scattered areas of
fibroglandular density.
FINDINGS: No suspicious calcifications, masses or areas of distortion are seen
in the bilateral breasts.

Mammographic images were processed with CAD.
IMPRESSION: 1. No mammographic findings in either breast to explain the
patient's bilateral intermittent diffuse breast pain.

2.  No mammographic evidence of malignancy in the bilateral breasts.

RECOMMENDATION:
1. Clinical follow-up recommended for the bilateral breast pain. Any
further workup should be based on clinical grounds.

2.  screening mammogram in one year.(Code:[id number]-[id number])

I have discussed the findings and recommendations with the patient.
If applicable, a reminder letter will be sent to the patient
regarding the next appointment.

BI-RADS CATEGORY  1: Negative.
